# Patient Record
Sex: Female | Born: 1945 | Race: White | Hispanic: No | Marital: Married | Smoking: Former smoker
Health system: Southern US, Community
[De-identification: ages and names within clinical notes are randomized; demographics above are authoritative.]

## PROBLEM LIST (undated history)

## (undated) DIAGNOSIS — F329 Major depressive disorder, single episode, unspecified: Secondary | ICD-10-CM

## (undated) DIAGNOSIS — F32A Depression, unspecified: Secondary | ICD-10-CM

## (undated) DIAGNOSIS — K219 Gastro-esophageal reflux disease without esophagitis: Secondary | ICD-10-CM

## (undated) HISTORY — PX: CHOLECYSTECTOMY: SHX55

---

## 2016-03-21 ENCOUNTER — Emergency Department: Payer: PRIVATE HEALTH INSURANCE

## 2016-03-21 ENCOUNTER — Encounter: Payer: Self-pay | Admitting: Emergency Medicine

## 2016-03-21 ENCOUNTER — Observation Stay
Admission: EM | Admit: 2016-03-21 | Discharge: 2016-03-22 | Disposition: A | Discharge status: Home / Self Care | Payer: PRIVATE HEALTH INSURANCE | Attending: Internal Medicine | Admitting: Internal Medicine

## 2016-03-21 DIAGNOSIS — F419 Anxiety disorder, unspecified: Secondary | ICD-10-CM | POA: Diagnosis not present

## 2016-03-21 DIAGNOSIS — I081 Rheumatic disorders of both mitral and tricuspid valves: Secondary | ICD-10-CM | POA: Insufficient documentation

## 2016-03-21 DIAGNOSIS — Z87891 Personal history of nicotine dependence: Secondary | ICD-10-CM | POA: Diagnosis not present

## 2016-03-21 DIAGNOSIS — Z882 Allergy status to sulfonamides status: Secondary | ICD-10-CM | POA: Diagnosis not present

## 2016-03-21 DIAGNOSIS — E860 Dehydration: Secondary | ICD-10-CM

## 2016-03-21 DIAGNOSIS — K219 Gastro-esophageal reflux disease without esophagitis: Secondary | ICD-10-CM | POA: Diagnosis not present

## 2016-03-21 DIAGNOSIS — I10 Essential (primary) hypertension: Secondary | ICD-10-CM | POA: Diagnosis not present

## 2016-03-21 DIAGNOSIS — F329 Major depressive disorder, single episode, unspecified: Secondary | ICD-10-CM | POA: Diagnosis not present

## 2016-03-21 DIAGNOSIS — R112 Nausea with vomiting, unspecified: Secondary | ICD-10-CM | POA: Diagnosis not present

## 2016-03-21 DIAGNOSIS — R778 Other specified abnormalities of plasma proteins: Secondary | ICD-10-CM | POA: Insufficient documentation

## 2016-03-21 DIAGNOSIS — Z9049 Acquired absence of other specified parts of digestive tract: Secondary | ICD-10-CM | POA: Diagnosis not present

## 2016-03-21 DIAGNOSIS — Z79899 Other long term (current) drug therapy: Secondary | ICD-10-CM | POA: Diagnosis not present

## 2016-03-21 DIAGNOSIS — R1111 Vomiting without nausea: Secondary | ICD-10-CM

## 2016-03-21 DIAGNOSIS — R7989 Other specified abnormal findings of blood chemistry: Secondary | ICD-10-CM

## 2016-03-21 DIAGNOSIS — Z8249 Family history of ischemic heart disease and other diseases of the circulatory system: Secondary | ICD-10-CM | POA: Diagnosis not present

## 2016-03-21 HISTORY — DX: Gastro-esophageal reflux disease without esophagitis: K21.9

## 2016-03-21 HISTORY — DX: Depression, unspecified: F32.A

## 2016-03-21 HISTORY — DX: Major depressive disorder, single episode, unspecified: F32.9

## 2016-03-21 LAB — DIFFERENTIAL
BASOS PCT: 1 %
Basophils Absolute: 0 10*3/uL (ref 0–0.1)
EOS ABS: 0.1 10*3/uL (ref 0–0.7)
EOS PCT: 1 %
LYMPHS ABS: 1.6 10*3/uL (ref 1.0–3.6)
Lymphocytes Relative: 25 %
MONOS PCT: 6 %
Monocytes Absolute: 0.4 10*3/uL (ref 0.2–0.9)
NEUTROS PCT: 67 %
Neutro Abs: 4.4 10*3/uL (ref 1.4–6.5)

## 2016-03-21 LAB — COMPREHENSIVE METABOLIC PANEL
ALBUMIN: 4.7 g/dL (ref 3.5–5.0)
ALT: 23 U/L (ref 14–54)
ANION GAP: 9 (ref 5–15)
AST: 24 U/L (ref 15–41)
Alkaline Phosphatase: 103 U/L (ref 38–126)
BILIRUBIN TOTAL: 0.6 mg/dL (ref 0.3–1.2)
BUN: 28 mg/dL — ABNORMAL HIGH (ref 6–20)
CHLORIDE: 103 mmol/L (ref 101–111)
CO2: 28 mmol/L (ref 22–32)
Calcium: 10.2 mg/dL (ref 8.9–10.3)
Creatinine, Ser: 1.01 mg/dL — ABNORMAL HIGH (ref 0.44–1.00)
GFR calc Af Amer: >60 mL/min (ref >60)
GFR, EST NON AFRICAN AMERICAN: 56 mL/min — AB (ref >60)
Glucose, Bld: 121 mg/dL — ABNORMAL HIGH (ref 65–99)
POTASSIUM: 3.6 mmol/L (ref 3.5–5.1)
Sodium: 140 mmol/L (ref 135–145)
TOTAL PROTEIN: 8.2 g/dL — AB (ref 6.5–8.1)

## 2016-03-21 LAB — TROPONIN I
TROPONIN I: 0.12 ng/mL — AB (ref <0.03)
Troponin I: 0.14 ng/mL (ref <0.03)

## 2016-03-21 LAB — PROTIME-INR
INR: 0.92
Prothrombin Time: 12.3 seconds (ref 11.4–15.2)

## 2016-03-21 LAB — CBC
HCT: 44.5 % (ref 35.0–47.0)
HEMOGLOBIN: 15.4 g/dL (ref 12.0–16.0)
MCH: 31.7 pg (ref 26.0–34.0)
MCHC: 34.7 g/dL (ref 32.0–36.0)
MCV: 91.4 fL (ref 80.0–100.0)
Platelets: 151 10*3/uL (ref 150–440)
RBC: 4.87 MIL/uL (ref 3.80–5.20)
RDW: 14.5 % (ref 11.5–14.5)
WBC: 6.6 10*3/uL (ref 3.6–11.0)

## 2016-03-21 LAB — APTT: aPTT: 26 seconds (ref 24–36)

## 2016-03-21 LAB — TSH: TSH: 2.497 u[IU]/mL (ref 0.350–4.500)

## 2016-03-21 MED ORDER — SODIUM CHLORIDE 0.9 % IV BOLUS (SEPSIS)
1000.0000 mL | Freq: Once | INTRAVENOUS | Status: AC
  Administered 2016-03-21: 1000 mL via INTRAVENOUS

## 2016-03-21 MED ORDER — ZOLPIDEM TARTRATE 5 MG PO TABS
5.0000 mg | ORAL_TABLET | Freq: Every evening | ORAL | Status: DC | PRN
  Administered 2016-03-22: 5 mg via ORAL
  Filled 2016-03-21: qty 1

## 2016-03-21 MED ORDER — ATENOLOL 25 MG PO TABS
50.0000 mg | ORAL_TABLET | Freq: Every day | ORAL | Status: DC
  Administered 2016-03-21 – 2016-03-22 (×2): 50 mg via ORAL
  Filled 2016-03-21 (×2): qty 2

## 2016-03-21 MED ORDER — PANTOPRAZOLE SODIUM 40 MG PO TBEC
40.0000 mg | DELAYED_RELEASE_TABLET | Freq: Every day | ORAL | Status: DC
  Administered 2016-03-22: 40 mg via ORAL
  Filled 2016-03-21: qty 1

## 2016-03-21 MED ORDER — ENOXAPARIN SODIUM 40 MG/0.4ML ~~LOC~~ SOLN
40.0000 mg | SUBCUTANEOUS | Status: DC
  Administered 2016-03-21: 40 mg via SUBCUTANEOUS
  Filled 2016-03-21: qty 0.4

## 2016-03-21 MED ORDER — ACETAMINOPHEN 650 MG RE SUPP: 650.0000 mg | Freq: Four times a day (QID) | RECTAL | Status: DC | PRN

## 2016-03-21 MED ORDER — IBUPROFEN 400 MG PO TABS
400.0000 mg | ORAL_TABLET | Freq: Four times a day (QID) | ORAL | Status: DC | PRN
  Administered 2016-03-21: 400 mg via ORAL
  Filled 2016-03-21: qty 1

## 2016-03-21 MED ORDER — SODIUM CHLORIDE 0.9 % IV SOLN: 250.0000 mL | INTRAVENOUS | Status: DC | PRN

## 2016-03-21 MED ORDER — VENLAFAXINE HCL ER 75 MG PO CP24
150.0000 mg | ORAL_CAPSULE | Freq: Every day | ORAL | Status: DC
  Administered 2016-03-22: 150 mg via ORAL
  Filled 2016-03-21: qty 1

## 2016-03-21 MED ORDER — ASPIRIN 81 MG PO CHEW
324.0000 mg | CHEWABLE_TABLET | Freq: Once | ORAL | Status: AC
  Administered 2016-03-21: 324 mg via ORAL
  Filled 2016-03-21: qty 4

## 2016-03-21 MED ORDER — ONDANSETRON HCL 4 MG PO TABS: 4.0000 mg | ORAL_TABLET | Freq: Four times a day (QID) | ORAL | Status: DC | PRN

## 2016-03-21 MED ORDER — SODIUM CHLORIDE 0.9% FLUSH: 3.0000 mL | INTRAVENOUS | Status: DC | PRN

## 2016-03-21 MED ORDER — ASPIRIN EC 325 MG PO TBEC
325.0000 mg | DELAYED_RELEASE_TABLET | Freq: Every day | ORAL | Status: DC
  Administered 2016-03-22: 325 mg via ORAL
  Filled 2016-03-21: qty 1

## 2016-03-21 MED ORDER — ACETAMINOPHEN 325 MG PO TABS
650.0000 mg | ORAL_TABLET | Freq: Four times a day (QID) | ORAL | Status: DC | PRN
Start: 2016-03-21 — End: 2016-03-22
  Administered 2016-03-21 – 2016-03-22 (×2): 650 mg via ORAL
  Filled 2016-03-21 (×2): qty 2

## 2016-03-21 MED ORDER — SODIUM CHLORIDE 0.9% FLUSH: 3.0000 mL | Freq: Two times a day (BID) | INTRAVENOUS | Status: DC

## 2016-03-21 MED ORDER — SODIUM CHLORIDE 0.9% FLUSH
3.0000 mL | Freq: Two times a day (BID) | INTRAVENOUS | Status: DC
Start: 2016-03-21 — End: 2016-03-22

## 2016-03-21 MED ORDER — ONDANSETRON HCL 4 MG/2ML IJ SOLN: 4.0000 mg | Freq: Four times a day (QID) | INTRAMUSCULAR | Status: DC | PRN

## 2016-03-21 MED ORDER — IBUPROFEN 400 MG PO TABS
ORAL_TABLET | ORAL | Status: AC
Start: 2016-03-21 — End: 2016-03-21
  Administered 2016-03-21: 400 mg via ORAL
  Filled 2016-03-21: qty 1

## 2016-03-21 NOTE — Progress Notes (Signed)
Pt. Request something for sleep. Dr. Dahlia BailiffHugelmyer ordered 5mg  Ambien.

## 2016-03-21 NOTE — ED Notes (Signed)
Informed RN Bed Ready

## 2016-03-21 NOTE — ED Triage Notes (Signed)
Pt here from home with c/o dizziness and vomiting that began yesterday, has had vertigo in the past, took medicine for a headache yesterday with a little relief. Pt states headache again today. Speech clear, grips strong and equal bilaterally. Pt also c/o high blood pressure this am at walk in clinic, has always had hypotension, however, 174/104 today.

## 2016-03-21 NOTE — ED Notes (Signed)
Pt transported to 258

## 2016-03-21 NOTE — ED Notes (Signed)
Patient denies pain and is resting comfortably.  

## 2016-03-21 NOTE — ED Provider Notes (Signed)
[facility] Emergency Department Provider Note ____________________________________________   I have reviewed the triage vital signs and the triage nursing note.  HISTORY  Chief Complaint Dizziness and Emesis   Historian Patient and her friend (she is from [place], speaks good English, declines interpreter, friend helps some)  HPI Gail Bowman is a 70 y.o. female here from [place] visiting, woke up yesterday with vomiting - nonbloody and nonbilious, multiple times yesterday. She felt weak afterward in the afternoon. She still felt weak this morning which is why she came in for evaluation. Denies palpitations or chest discomfort. She feels like the nausea is essentially better now. No history of coronary artery disease. No reported history of hypertension. She does not take aspirin.  Weakness is moderate, she is feeling a tiny bit better after IV fluid bolus in the subwait area.    History reviewed. No pertinent past medical history.  There are no active problems to display for this patient.   Past Surgical History:  Procedure Laterality Date  . CHOLECYSTECTOMY      Prior to Admission medications   Not on File    Allergies  Allergen Reactions  . Sulfa Antibiotics Itching    No family history on file.  Social History Social History  Substance Use Topics  . Smoking status: Former Games developermoker  . Smokeless tobacco: Never Used  . Alcohol use Yes     Comment: occas.    Review of Systems  Constitutional: Negative for fever. Eyes: Negative for visual changes. ENT: Negative for sore throat. Cardiovascular: Negative for chest pain. Respiratory: Negative for shortness of breath. Gastrointestinal: Negative for diarrhea. Genitourinary: Negative for dysuria. Musculoskeletal: Negative for back pain. Skin: Negative for rash. Neurological: Negative for headache. 10 point Review of Systems otherwise  negative ____________________________________________   PHYSICAL EXAM:  VITAL SIGNS: ED Triage Vitals  Enc Vitals Group     BP 03/21/16 1205 (!) 174/104     Pulse Rate 03/21/16 1205 85     Resp 03/21/16 1432 16     Temp 03/21/16 1205 98.2 F (36.8 C)     Temp Source 03/21/16 1205 Oral     SpO2 03/21/16 1205 100 %     Weight 03/21/16 1206 149 lb 14.6 oz (68 kg)     Height 03/21/16 1206 5' 4.17" (1.63 m)     Head Circumference --      Peak Flow --      Pain Score 03/21/16 1207 5     Pain Loc --      Pain Edu? --      Excl. in GC? --      Constitutional: Alert and oriented. Well appearing and in no distress. HEENT   Head: Normocephalic and atraumatic.      Eyes: Conjunctivae are normal. PERRL. Normal extraocular movements.      Ears:         Nose: No congestion/rhinnorhea.   Mouth/Throat: Mucous membranes are moist.   Neck: No stridor. Cardiovascular/Chest: Normal rate, regular rhythm.  No murmurs, rubs, or gallops. Respiratory: Normal respiratory effort without tachypnea nor retractions. Breath sounds are clear and equal bilaterally. No wheezes/rales/rhonchi. Gastrointestinal: Soft. No distention, no guarding, no rebound. Nontender.    Genitourinary/rectal:Deferred Musculoskeletal: Nontender with normal range of motion in all extremities. No joint effusions.  No lower extremity tenderness.  No edema. Neurologic:  Normal speech and language. No gross or focal neurologic deficits are appreciated. Skin:  Skin is warm, dry and intact. No rash noted. Psychiatric:  Mood and affect are normal. Speech and behavior are normal. Patient exhibits appropriate insight and judgment.   ____________________________________________  LABS (pertinent positives/negatives)  Labs Reviewed  COMPREHENSIVE METABOLIC PANEL - Abnormal; Notable for the following:       Result Value   Glucose, Bld 121 (*)    BUN 28 (*)    Creatinine, Ser 1.01 (*)    Total Protein 8.2 (*)    GFR calc  non Af Amer 56 (*)    All other components within normal limits  TROPONIN I - Abnormal; Notable for the following:    Troponin I 0.12 (*)    All other components within normal limits  PROTIME-INR  APTT  CBC  DIFFERENTIAL  CBG MONITORING, ED    ____________________________________________    EKG I, Governor Rooks, MD, the attending physician have personally viewed and interpreted all ECGs.  80 bpm. Normal sinus rhythm. normal axis. Normal ST and T-wave ____________________________________________  RADIOLOGY All Xrays were viewed by me. Imaging interpreted by Radiologist.  CT without contrast: No hemorrhage or visible infarct. ASPECTS is 10.  CXR 2 view: No active cardiopulmonary disease __________________________________________  PROCEDURES  Procedure(s) performed: None  Critical Care performed: None  ____________________________________________   ED COURSE / ASSESSMENT AND PLAN  Pertinent labs & imaging results that were available during my care of the patient were reviewed by me and considered in my medical decision making (see chart for details).   [name] is here with her friend, for weakness after vomiting all day yesterday.  Her BUN and creatinine are elevated consistent with a mild acute renal failure which is suspicious for dehydration given the clinical history. Her troponin is slightly elevated 0.12, and her EKG is reassuring today. However this is pretty abnormal, I discussed with her my recommendation for further evaluation and observation overnight.  She was given aspirin here in the emergency department. She is given a second liter fluid for the dehydration.    CONSULTATIONS:   Hospitalist for admission.   Patient / Family / Caregiver informed of clinical course, medical decision-making process, and agree with plan.  ___________________________________________   FINAL CLINICAL IMPRESSION(S) / ED DIAGNOSES   Final diagnoses:  Troponin I  above reference range  Dehydration  Non-intractable vomiting without nausea, vomiting of unspecified type              Note: This dictation was prepared with Dragon dictation. Any transcriptional errors that result from this process are unintentional    Governor Rooks, MD 03/21/16 1554

## 2016-03-21 NOTE — H&P (Signed)
[facility] [place] [facility]   PATIENT NAME: Gail Bowman    MR#:  [id number]  DATE OF BIRTH:  1945/12/24  DATE OF ADMISSION:  03/21/2016  PRIMARY CARE PHYSICIAN: No primary care provider on file.   REQUESTING/REFERRING PHYSICIAN: Dr Shaune Pollack  CHIEF COMPLAINT:   Chief Complaint  Patient presents with  . Dizziness  . Emesis    HISTORY OF PRESENT ILLNESS: Gail Bowman  is a 70 y.o. female with a known history of  Depression And GERD who started having nausea and vomiting since yesterday. Patient's symptoms continue persist throughout the day. And then the nausea and vomiting stopped last night. This morning she started feeling dizzy. Therefore she came to the ER. In the ER she is noted to have a troponin that is elevated. She she did have some substernal discomfort yesterday but none today. Did not have any shortness of breath no palpitations no radiation of the pain. PAST MEDICAL HISTORY:   Past Medical History:  Diagnosis Date  . Depression   . GERD (gastroesophageal reflux disease)     PAST SURGICAL HISTORY: Past Surgical History:  Procedure Laterality Date  . CHOLECYSTECTOMY      SOCIAL HISTORY:  Social History  Substance Use Topics  . Smoking status: Former Games developer  . Smokeless tobacco: Never Used  . Alcohol use Yes     Comment: occas.    FAMILY HISTORY:  Family History  Problem Relation Age of Onset  . CAD Sister     DRUG ALLERGIES:  Allergies  Allergen Reactions  . Sulfa Antibiotics Itching    REVIEW OF SYSTEMS:   CONSTITUTIONAL: No fever, fatigue or weakness.  EYES: No blurred or double vision.  EARS, NOSE, AND THROAT: No tinnitus or ear pain.  RESPIRATORY: No cough, shortness of breath, wheezing or hemoptysis.  CARDIOVASCULAR: No chest pain, orthopnea, edema.  GASTROINTESTINAL: No nausea, vomiting, diarrhea or abdominal pain.  GENITOURINARY: No dysuria, hematuria.  ENDOCRINE: No polyuria, nocturia,  HEMATOLOGY: No  anemia, easy bruising or bleeding SKIN: No rash or lesion. MUSCULOSKELETAL: No joint pain or arthritis.   NEUROLOGIC: No tingling, numbness, weakness.  PSYCHIATRY: No anxiety or depression.   MEDICATIONS AT HOME:  Prior to Admission medications   Medication Sig Start Date End Date Taking? Authorizing Provider  ibuprofen (ADVIL,MOTRIN) 400 MG tablet Take 400 mg by mouth every 6 (six) hours as needed.   Yes Historical Provider, MD  omeprazole (PRILOSEC) 10 MG capsule Take 10 mg by mouth daily.   Yes Historical Provider, MD  venlafaxine (EFFEXOR) 75 MG tablet Take 150 mg by mouth daily.    Yes Historical Provider, MD  acetaminophen (TYLENOL) 500 MG tablet Take 500 mg by mouth every 6 (six) hours as needed.    Historical Provider, MD      PHYSICAL EXAMINATION:   VITAL SIGNS: Blood pressure (!) 175/85, pulse 83, temperature 98.2 F (36.8 C), temperature source Oral, resp. rate 16, height 5' 4.17" (1.63 m), weight 149 lb 14.6 oz (68 kg), SpO2 99 %.  GENERAL:  70 y.o.-year-old patient lying in the bed with no acute distress.  EYES: Pupils equal, round, reactive to light and accommodation. No scleral icterus. Extraocular muscles intact.  HEENT: Head atraumatic, normocephalic. Oropharynx and nasopharynx clear.  NECK:  Supple, no jugular venous distention. No thyroid enlargement, no tenderness.  LUNGS: Normal breath sounds bilaterally, no wheezing, rales,rhonchi or crepitation. No use of accessory muscles of respiration.  CARDIOVASCULAR: S1, S2 normal. No murmurs, rubs, or gallops.  ABDOMEN: Soft, nontender, nondistended. Bowel sounds present. No organomegaly or mass.  EXTREMITIES: No pedal edema, cyanosis, or clubbing.  NEUROLOGIC: Cranial nerves II through XII are intact. Muscle strength 5/5 in all extremities. Sensation intact. Gait not checked.  PSYCHIATRIC: The patient is alert and oriented x 3.  SKIN: No obvious rash, lesion, or ulcer.   LABORATORY PANEL:   CBC  Recent Labs Lab  03/21/16 1220  WBC 6.6  HGB 15.4  HCT 44.5  PLT 151  MCV 91.4  MCH 31.7  MCHC 34.7  RDW 14.5  LYMPHSABS 1.6  MONOABS 0.4  EOSABS 0.1  BASOSABS 0.0   ------------------------------------------------------------------------------------------------------------------  Chemistries   Recent Labs Lab 03/21/16 1220  NA 140  K 3.6  CL 103  CO2 28  GLUCOSE 121*  BUN 28*  CREATININE 1.01*  CALCIUM 10.2  AST 24  ALT 23  ALKPHOS 103  BILITOT 0.6   ------------------------------------------------------------------------------------------------------------------ estimated creatinine clearance is 50.7 mL/min (by C-G formula based on SCr of 1.01 mg/dL (H)). ------------------------------------------------------------------------------------------------------------------ No results for input(s): TSH, T4TOTAL, T3FREE, THYROIDAB in the last 72 hours.  Invalid input(s): FREET3   Coagulation profile  Recent Labs Lab 03/21/16 1220  INR 0.92   ------------------------------------------------------------------------------------------------------------------- No results for input(s): DDIMER in the last 72 hours. -------------------------------------------------------------------------------------------------------------------  Cardiac Enzymes  Recent Labs Lab 03/21/16 1220  TROPONINI 0.12*   ------------------------------------------------------------------------------------------------------------------ Invalid input(s): POCBNP  ---------------------------------------------------------------------------------------------------------------  Urinalysis No results found for: COLORURINE, APPEARANCEUR, LABSPEC, PHURINE, GLUCOSEU, HGBUR, BILIRUBINUR, KETONESUR, PROTEINUR, UROBILINOGEN, NITRITE, LEUKOCYTESUR   RADIOLOGY: Dg Chest 2 View  Result Date: 03/21/2016 CLINICAL DATA:  Nausea, vomiting, dizziness since yesterday. Chest pain. EXAM: CHEST  2 VIEW COMPARISON:  None.  FINDINGS: The heart size and mediastinal contours are within normal limits. Both lungs are clear. The visualized skeletal structures are unremarkable. IMPRESSION: No active cardiopulmonary disease. Electronically Signed   By: Charlett NoseKevin  Dover M.D.   On: 03/21/2016 15:30   Ct Head Code Stroke W/o Cm  Result Date: 03/21/2016 CLINICAL DATA:  Code stroke. Dizziness and vomiting that began yesterday. EXAM: CT HEAD WITHOUT CONTRAST TECHNIQUE: Contiguous axial images were obtained from the base of the skull through the vertex without intravenous contrast. COMPARISON:  None. FINDINGS: No hemorrhage or visible infarct. No hydrocephalus, mass, or swelling. No hyperdense vessel. Minimal periventricular white matter low density, likely mild chronic microvascular ischemia in this patient with history of hypertension ASPECTS (Alberta Stroke Program Early CT Score) - Ganglionic level infarction (caudate, lentiform nuclei, internal capsule, insula, M1-M3 cortex): 7 - Supraganglionic infarction (M4-M6 cortex): 3 Total score (0-10 with 10 being normal): 10 These results were called by telephone at the time of interpretation on 03/21/2016 at 12:41 pm to Dr. Willy EddyPATRICK ROBINSON , who verbally acknowledged these results. IMPRESSION: 1. No hemorrhage or visible infarct. 2. ASPECTS is 10. Electronically Signed   By: Marnee SpringJonathon  Watts M.D.   On: 03/21/2016 12:43    EKG: Orders placed or performed during the hospital encounter of 03/21/16  . ED EKG  . ED EKG    IMPRESSION AND PLAN: Patient is a 70 year old white female with nausea vomiting noted to have elevated troponin  1. Elevated troponin possibly related to demand ischemia from her nausea vomiting We will place under observation, monitor serial cardiac enzymes We will ask cardiology to see Treat with aspirin Echocardiogram of the heart  2. Elevated blood pressure with no history of hypertension We'll start patient on atenolol  3. Depression and anxiety continue  Effexor  4. GERD we'll continue PPI  5. Miscellaneous we'll do Lovenox for DVT prophylaxis   All the records are reviewed and case discussed with ED provider. Management plans discussed with the patient, family and they are in agreement.  CODE STATUS:    Code Status Orders        Start     Ordered   03/21/16 1538  Full code  Continuous     03/21/16 1538    Code Status History    Date Active Date Inactive Code Status Order ID Comments User Context   This patient has a current code status but no historical code status.       TOTAL TIME TAKING CARE OF THIS PATIENT: 45 minutes.    Auburn Bilberry M.D on 03/21/2016 at 3:42 PM  Between 7am to 6pm - Pager - [phone]  After 6pm go to [email or web address] - password [email or web address] [facility]  [facility]  Office  [phone]  CC: Primary care physician; No primary care provider on file.

## 2016-03-22 ENCOUNTER — Observation Stay
Admit: 2016-03-22 | Discharge: 2016-03-22 | Disposition: A | Discharge status: Home / Self Care | Payer: PRIVATE HEALTH INSURANCE | Attending: Internal Medicine | Admitting: Internal Medicine

## 2016-03-22 DIAGNOSIS — R112 Nausea with vomiting, unspecified: Secondary | ICD-10-CM | POA: Diagnosis not present

## 2016-03-22 LAB — ECHOCARDIOGRAM COMPLETE
HEIGHTINCHES: 64 in
Weight: 2304 oz

## 2016-03-22 LAB — TROPONIN I
Troponin I: 0.12 ng/mL (ref <0.03)
Troponin I: 0.1 ng/mL (ref <0.03)

## 2016-03-22 MED ORDER — ISOSORBIDE MONONITRATE ER 30 MG PO TB24: 30.0000 mg | ORAL_TABLET | Freq: Every day | ORAL | 0 refills | Status: AC

## 2016-03-22 MED ORDER — METOPROLOL TARTRATE 25 MG PO TABS: 25.0000 mg | ORAL_TABLET | Freq: Two times a day (BID) | ORAL | 0 refills | Status: AC

## 2016-03-22 MED ORDER — ASPIRIN EC 81 MG PO TBEC: 81.0000 mg | DELAYED_RELEASE_TABLET | Freq: Every day | ORAL | 0 refills | Status: DC

## 2016-03-22 MED ORDER — ISOSORBIDE MONONITRATE ER 30 MG PO TB24: 30.0000 mg | ORAL_TABLET | Freq: Every day | ORAL | 0 refills | Status: DC

## 2016-03-22 MED ORDER — ASPIRIN EC 81 MG PO TBEC: 81.0000 mg | DELAYED_RELEASE_TABLET | Freq: Every day | ORAL | 0 refills | Status: AC

## 2016-03-22 MED ORDER — METOPROLOL TARTRATE 25 MG PO TABS: 25.0000 mg | ORAL_TABLET | Freq: Two times a day (BID) | ORAL | Status: DC

## 2016-03-22 NOTE — Consult Note (Signed)
[facility] Cardiology  CARDIOLOGY CONSULT NOTE  Patient ID: Gail Bowman MRN: [id number] DOB/AGE: [birth year] 70 y.o.  Admit date: 03/21/2016 Referring Physician Allena Katz Primary Physician  Primary Cardiologist  Reason for Consultation Borderline elevated troponin  HPI: 70 year old female referred for borderline elevated troponin. The patient is visiting from [place]. She has a history of gastroesophageal reflux disease. She presented to [facility] emergency room with nausea and vomiting. Admission labs were notable for borderline elevated troponin of 0.12, followed by 0.14, 0.12, and 0.1. The patient denies a history of chest pain or shortness of breath. Nausea and vomiting has resolved. ECG was nondiagnostic.  Review of systems complete and found to be negative unless listed above     Past Medical History:  Diagnosis Date  . Depression   . GERD (gastroesophageal reflux disease)     Past Surgical History:  Procedure Laterality Date  . CHOLECYSTECTOMY      Prescriptions Prior to Admission  Medication Sig Dispense Refill Last Dose  . ibuprofen (ADVIL,MOTRIN) 400 MG tablet Take 400 mg by mouth every 6 (six) hours as needed.   03/20/2016 at Unknown time  . omeprazole (PRILOSEC) 10 MG capsule Take 10 mg by mouth daily.   03/21/2016 at 1000  . venlafaxine (EFFEXOR) 75 MG tablet Take 150 mg by mouth daily.    03/21/2016 at 1000  . acetaminophen (TYLENOL) 500 MG tablet Take 500 mg by mouth every 6 (six) hours as needed.      Social History   Social History  . Marital status: Married    Spouse name: N/A  . Number of children: N/A  . Years of education: N/A   Occupational History  . Not on file.   Social History Main Topics  . Smoking status: Former Games developer  . Smokeless tobacco: Never Used  . Alcohol use 0.6 oz/week    1 Cans of beer per week     Comment: occas.  . Drug use: No  . Sexual activity: Not on file   Other Topics Concern  . Not on file   Social History Narrative  . No narrative  on file    Family History  Problem Relation Age of Onset  . CAD Sister       Review of systems complete and found to be negative unless listed above      PHYSICAL EXAM  General: Well developed, well nourished, in no acute distress HEENT:  Normocephalic and atramatic Neck:  No JVD.  Lungs: Clear bilaterally to auscultation and percussion. Heart: HRRR . Normal S1 and S2 without gallops or murmurs.  Abdomen: Bowel sounds are positive, abdomen soft and non-tender  Msk:  Back normal, normal gait. Normal strength and tone for age. Extremities: No clubbing, cyanosis or edema.   Neuro: Alert and oriented X 3. Psych:  Good affect, responds appropriately  Labs:   Lab Results  Component Value Date   WBC 6.6 03/21/2016   HGB 15.4 03/21/2016   HCT 44.5 03/21/2016   MCV 91.4 03/21/2016   PLT 151 03/21/2016    Recent Labs Lab 03/21/16 1220  NA 140  K 3.6  CL 103  CO2 28  BUN 28*  CREATININE 1.01*  CALCIUM 10.2  PROT 8.2*  BILITOT 0.6  ALKPHOS 103  ALT 23  AST 24  GLUCOSE 121*   Lab Results  Component Value Date   TROPONINI 0.10 (HH) 03/22/2016   No results found for: CHOL No results found for: HDL No results found for: LDLCALC No results found  for: TRIG No results found for: CHOLHDL No results found for: LDLDIRECT    Radiology: Dg Chest 2 View  Result Date: 03/21/2016 CLINICAL DATA:  Nausea, vomiting, dizziness since yesterday. Chest pain. EXAM: CHEST  2 VIEW COMPARISON:  None. FINDINGS: The heart size and mediastinal contours are within normal limits. Both lungs are clear. The visualized skeletal structures are unremarkable. IMPRESSION: No active cardiopulmonary disease. Electronically Signed   By: Charlett NoseKevin  Dover M.D.   On: 03/21/2016 15:30   Ct Head Code Stroke W/o Cm  Result Date: 03/21/2016 CLINICAL DATA:  Code stroke. Dizziness and vomiting that began yesterday. EXAM: CT HEAD WITHOUT CONTRAST TECHNIQUE: Contiguous axial images were obtained from the base of  the skull through the vertex without intravenous contrast. COMPARISON:  None. FINDINGS: No hemorrhage or visible infarct. No hydrocephalus, mass, or swelling. No hyperdense vessel. Minimal periventricular white matter low density, likely mild chronic microvascular ischemia in this patient with history of hypertension ASPECTS (Alberta Stroke Program Early CT Score) - Ganglionic level infarction (caudate, lentiform nuclei, internal capsule, insula, M1-M3 cortex): 7 - Supraganglionic infarction (M4-M6 cortex): 3 Total score (0-10 with 10 being normal): 10 These results were called by telephone at the time of interpretation on 03/21/2016 at 12:41 pm to Dr. Willy EddyPATRICK ROBINSON , who verbally acknowledged these results. IMPRESSION: 1. No hemorrhage or visible infarct. 2. ASPECTS is 10. Electronically Signed   By: Marnee SpringJonathon  Watts M.D.   On: 03/21/2016 12:43    EKG: Normal sinus rhythm  ASSESSMENT AND PLAN:   1. Borderline elevated troponin, without peak or trough, in the absence of chest pain, in the setting of nausea and vomiting, with nondiagnostic ECG, suspect demand supply ischemia and not acute coronary syndrome  Recommendations  1. Agree with overall current therapy 2. Defer full dose anticoagulation 3. Review 2-D echocardiogram 4. Further workup could be performed as outpatient  Signed: Keyleigh Manninen MD,PhD, [facility] 03/22/2016, 9:16 AM

## 2016-03-22 NOTE — Discharge Summary (Signed)
Gail Bowman, is a 70 y.o. female  DOB May 15, 1946  MRN [id number].  Admission date:  03/21/2016  Admitting Physician  Auburn Bilberry, MD  Discharge Date:  03/22/2016   Primary MD  Pcp Not In System  Recommendations for primary care physician for things to follow:  Follow up with  PMD in [place] in one week   Admission Diagnosis  Dehydration [E86.0] Troponin I above reference range [R79.89] Non-intractable vomiting without nausea, vomiting of unspecified type [R11.11]   Discharge Diagnosis  Dehydration [E86.0] Troponin I above reference range [R79.89] Non-intractable vomiting without nausea, vomiting of unspecified type [R11.11]    Active Problems:   Elevated troponin      Past Medical History:  Diagnosis Date  . Depression   . GERD (gastroesophageal reflux disease)     Past Surgical History:  Procedure Laterality Date  . CHOLECYSTECTOMY         History of present illness and  Hospital Course:     Kindly see H&P for history of present illness and admission details, please review complete Labs, Consult reports and Test reports for all details in brief  HPI  from the history and physical done on the day of admission 70 year old female patient without any past medical history comes in because of vomiting, found to have a troponin of 0.12/0.14. Because of that she is admitted to hospitalist service for evaluation of acute MI.   Hospital Course  #1 intractable nausea and vomiting causing elevated troponins: Patient admitted to telemetry, started on aspirin, metoprolol, seen by cardiology. Did not have any chest pain, no EKG changes. Patient lab data CBC, Chem-7 within normal limits. Cardiology felt the patient can have echocardiogram and have further cardiac workup and outpatient. Patient discharging back to  [place] on Tuesday. Explained to her that patient if the echocardiogram is normal she'll be discharged home and she can follow up with her primary doctor in [place] and have further cardiac imaging is as needed. #2-elevated hypertension elevated blood pressure without history of hypertension. Patient will be given prescription for metoprolol, Imdur, aspirin. pt has no questions   Discharge Condition: stable   Follow UP      Discharge Instructions  and  Discharge Medications        Medication List    TAKE these medications   acetaminophen 500 MG tablet Commonly known as:  TYLENOL Take 500 mg by mouth every 6 (six) hours as needed.   aspirin EC 81 MG tablet Take 1 tablet (81 mg total) by mouth daily.   ibuprofen 400 MG tablet Commonly known as:  ADVIL,MOTRIN Take 400 mg by mouth every 6 (six) hours as needed.   isosorbide mononitrate 30 MG 24 hr tablet Commonly known as:  IMDUR Take 1 tablet (30 mg total) by mouth daily.   metoprolol tartrate 25 MG tablet Commonly known as:  LOPRESSOR Take 1 tablet (25 mg total) by mouth 2 (two) times daily.   omeprazole 10 MG capsule Commonly known as:  PRILOSEC Take 10 mg by mouth daily.   venlafaxine 75 MG tablet Commonly known as:  EFFEXOR Take 150 mg by mouth daily.         Diet and Activity recommendation: See Discharge Instructions above   Consults obtained - cardio   Major procedures and Radiology Reports - PLEASE review detailed and final reports for all details, in brief -     Dg Chest 2 View  Result Date: 03/21/2016 CLINICAL DATA:  Nausea, vomiting, dizziness since yesterday.  Chest pain. EXAM: CHEST  2 VIEW COMPARISON:  None. FINDINGS: The heart size and mediastinal contours are within normal limits. Both lungs are clear. The visualized skeletal structures are unremarkable. IMPRESSION: No active cardiopulmonary disease. Electronically Signed   By: Charlett NoseKevin  Dover M.D.   On: 03/21/2016 15:30   Ct Head Code Stroke  W/o Cm  Result Date: 03/21/2016 CLINICAL DATA:  Code stroke. Dizziness and vomiting that began yesterday. EXAM: CT HEAD WITHOUT CONTRAST TECHNIQUE: Contiguous axial images were obtained from the base of the skull through the vertex without intravenous contrast. COMPARISON:  None. FINDINGS: No hemorrhage or visible infarct. No hydrocephalus, mass, or swelling. No hyperdense vessel. Minimal periventricular white matter low density, likely mild chronic microvascular ischemia in this patient with history of hypertension ASPECTS (Alberta Stroke Program Early CT Score) - Ganglionic level infarction (caudate, lentiform nuclei, internal capsule, insula, M1-M3 cortex): 7 - Supraganglionic infarction (M4-M6 cortex): 3 Total score (0-10 with 10 being normal): 10 These results were called by telephone at the time of interpretation on 03/21/2016 at 12:41 pm to Dr. Willy EddyPATRICK ROBINSON , who verbally acknowledged these results. IMPRESSION: 1. No hemorrhage or visible infarct. 2. ASPECTS is 10. Electronically Signed   By: Marnee SpringJonathon  Watts M.D.   On: 03/21/2016 12:43    Micro Results    No results found for this or any previous visit (from the past 240 hour(s)).     Today   Subjective:   Cameren Decker today has no headache,no chest abdominal pain,no new weakness tingling or numbness, feels much better wants to go home today.   Objective:   Blood pressure 126/61, pulse 63, temperature 98.5 F (36.9 C), temperature source Oral, resp. rate 18, height 5\' 4"  (1.626 m), weight 65.3 kg (144 lb), SpO2 97 %.   Intake/Output Summary (Last 24 hours) at 03/22/16 1053 Last data filed at 03/22/16 0900  Gross per 24 hour  Intake              240 ml  Output                1 ml  Net              239 ml    Exam Awake Alert, Oriented x 3, No new F.N deficits, Normal affect [name].AT,PERRAL Supple Neck,No JVD, No cervical lymphadenopathy appriciated.  Symmetrical Chest wall movement, Good air movement bilaterally,  CTAB RRR,No Gallops,Rubs or new Murmurs, No Parasternal Heave +ve B.Sounds, Abd Soft, Non tender, No organomegaly appriciated, No rebound -guarding or rigidity. No Cyanosis, Clubbing or edema, No new Rash or bruise  Data Review   CBC w Diff:  Lab Results  Component Value Date   WBC 6.6 03/21/2016   HGB 15.4 03/21/2016   HCT 44.5 03/21/2016   PLT 151 03/21/2016   LYMPHOPCT 25 03/21/2016   MONOPCT 6 03/21/2016   EOSPCT 1 03/21/2016   BASOPCT 1 03/21/2016    CMP:  Lab Results  Component Value Date   NA 140 03/21/2016   K 3.6 03/21/2016   CL 103 03/21/2016   CO2 28 03/21/2016   BUN 28 (H) 03/21/2016   CREATININE 1.01 (H) 03/21/2016   PROT 8.2 (H) 03/21/2016   ALBUMIN 4.7 03/21/2016   BILITOT 0.6 03/21/2016   ALKPHOS 103 03/21/2016   AST 24 03/21/2016   ALT 23 03/21/2016  .   Total Time in preparing paper work, data evaluation and todays exam - 35 minutes  Breiana Stratmann M.D on 03/22/2016 at 10:53 AM  Note: This dictation was prepared with Dragon dictation along with smaller phrase technology. Any transcriptional errors that result from this process are unintentional.

## 2016-03-22 NOTE — Progress Notes (Signed)
A&O. Independent. Medicated for headache and insomnia. Slept the rest of the night with no complaints.

## 2018-05-31 IMAGING — CT CT HEAD CODE STROKE
3 series · 15 of 46 positions shown, 18 images · non-contrast
Comparison: None.

CLINICAL DATA: Code stroke. Dizziness and vomiting that began
yesterday.

EXAM:
CT HEAD WITHOUT CONTRAST
TECHNIQUE: Contiguous axial images were obtained from the base of the skull
through the vertex without intravenous contrast.

[Series 2: head wo · axial · 0.41mm/px · z∈[-36,+84]mm · 9 of 29 slices shown, 12 images]
[im 3/29  brain]
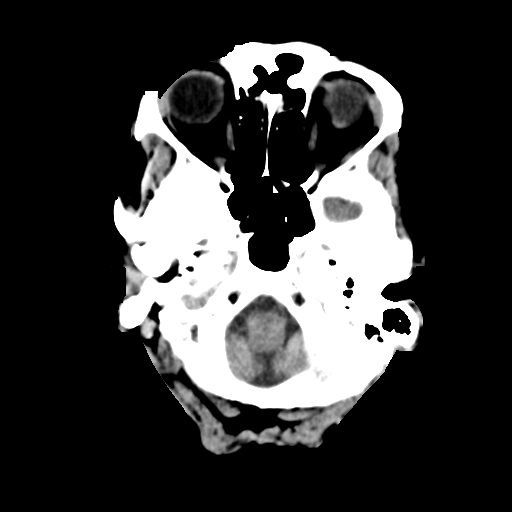
[im 3/29  bone]
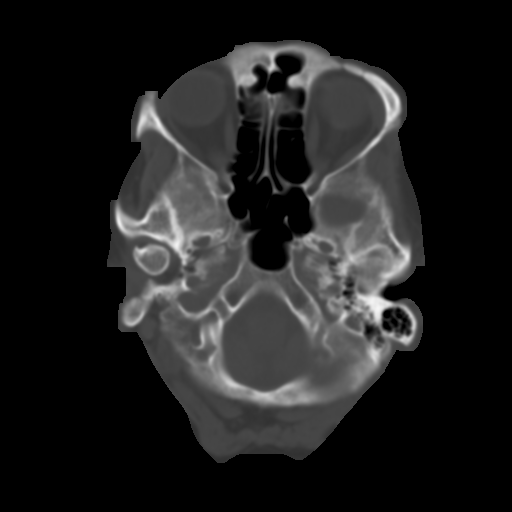
[im 6/29  brain]
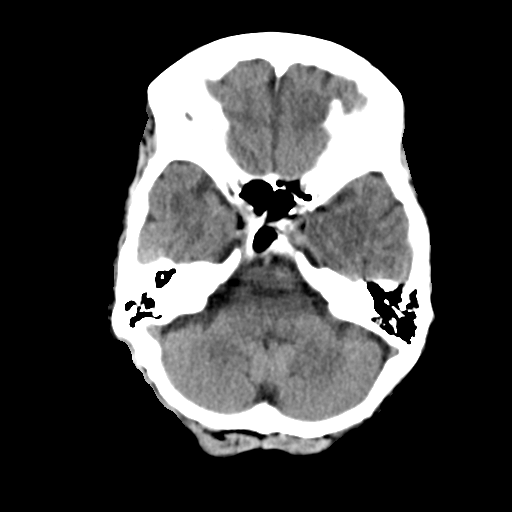
[im 9/29  brain]
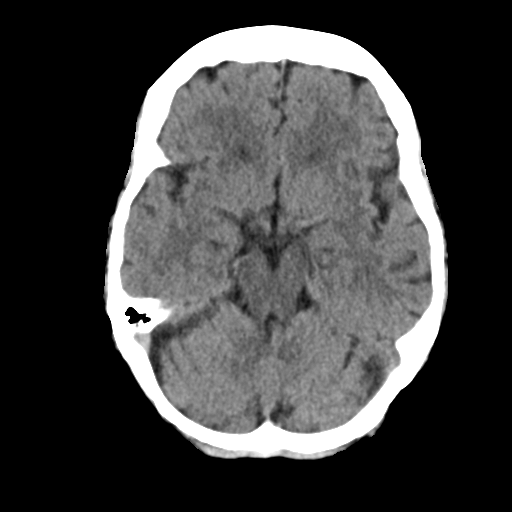
[im 12/29  brain]
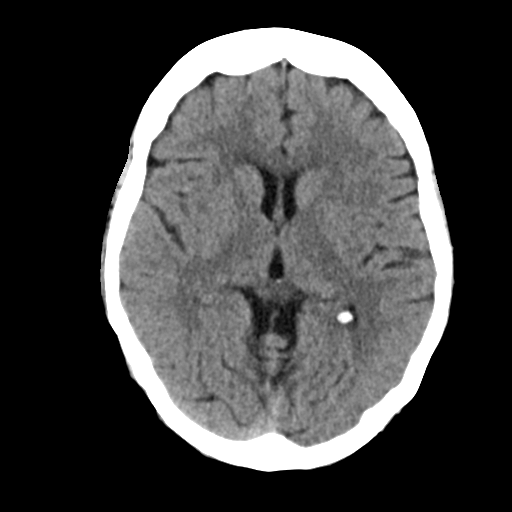
[im 15/29  brain]
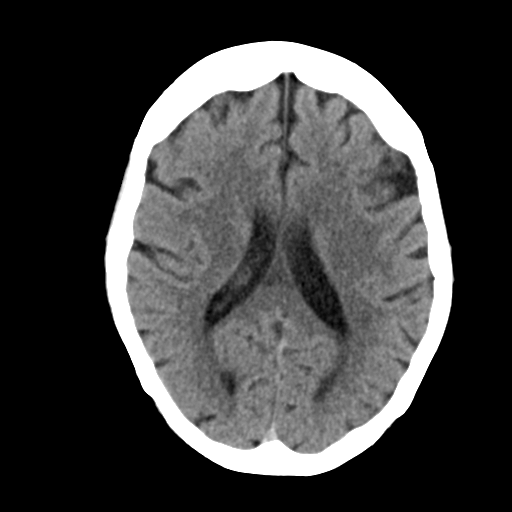
[im 15/29  bone]
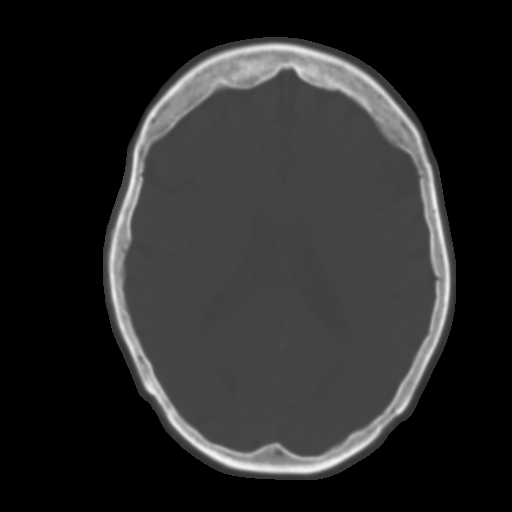
[im 18/29  brain]
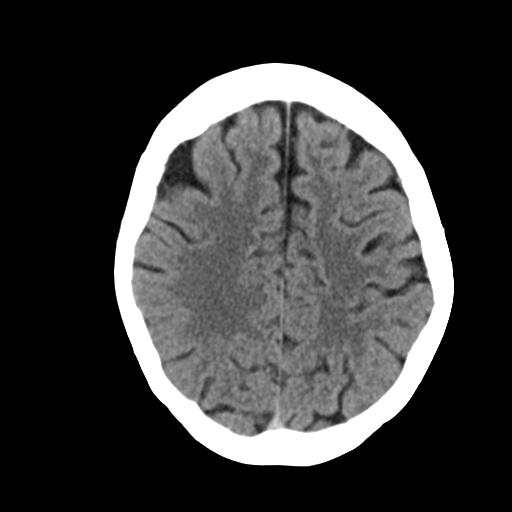
[im 21/29  brain]
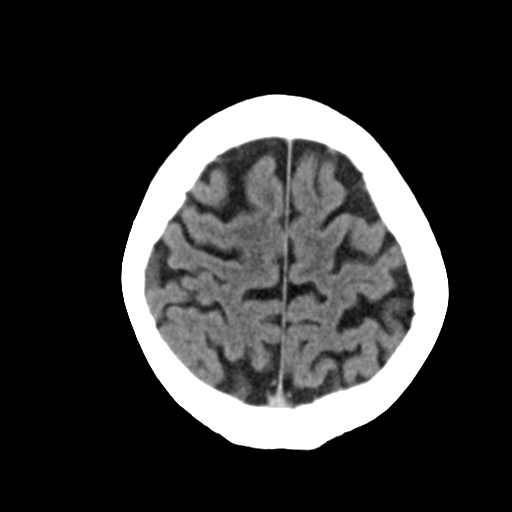
[im 24/29  brain]
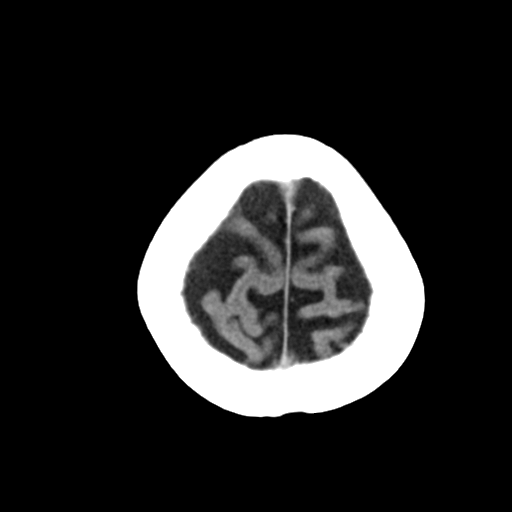
[im 27/29  brain]
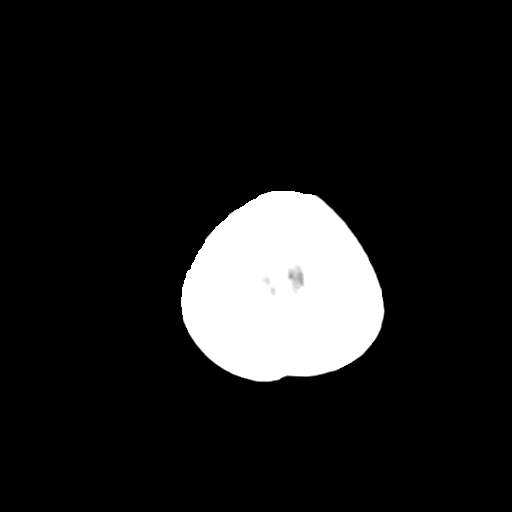
[im 27/29  bone]
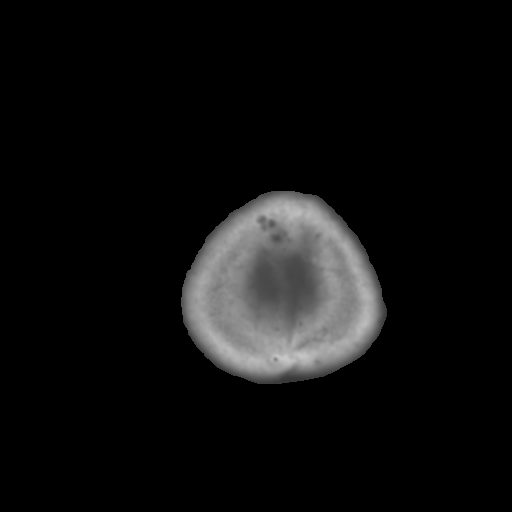

[Series 4: coronal soft tissue · coronal · 0.29mm/px · 3 of 63 slices shown]
[im 21/63  brain]
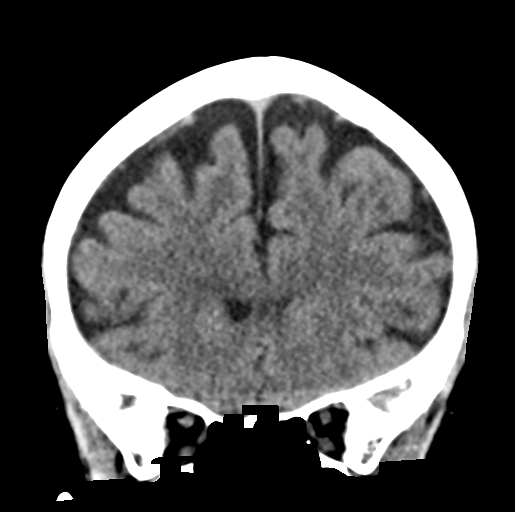
[im 28/63  brain]
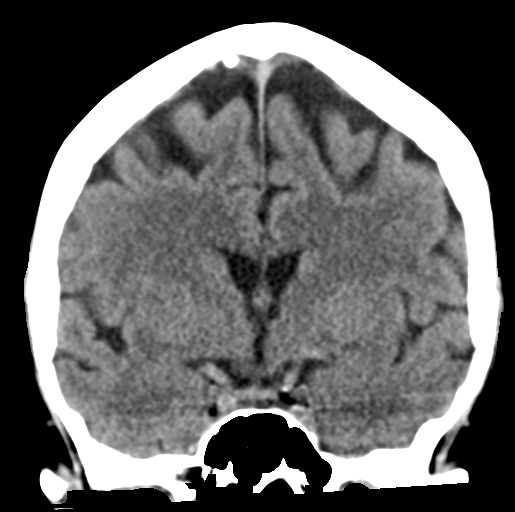
[im 35/63  brain]
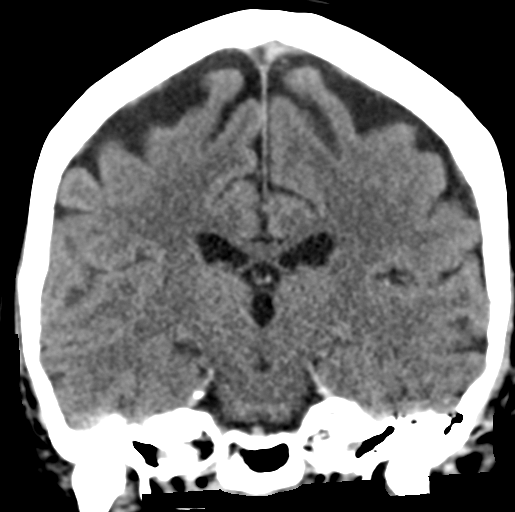

[Series 5: sagittal soft tissue · sagittal · 0.28mm/px · 3 of 51 slices shown]
[im 17/51  brain]
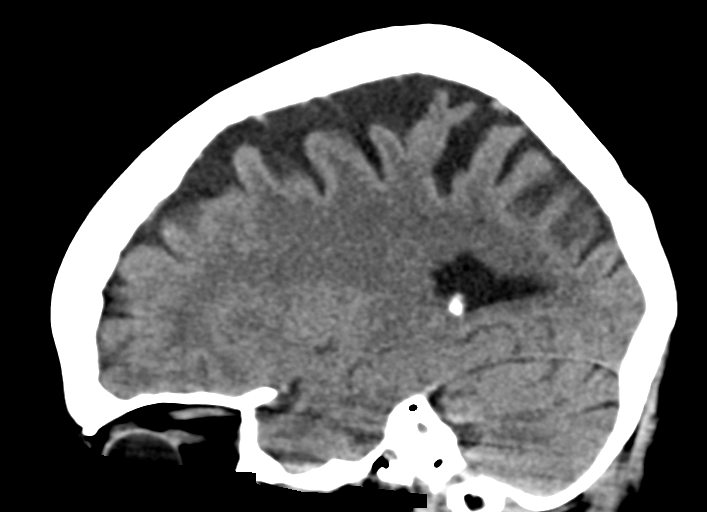
[im 26/51  brain]
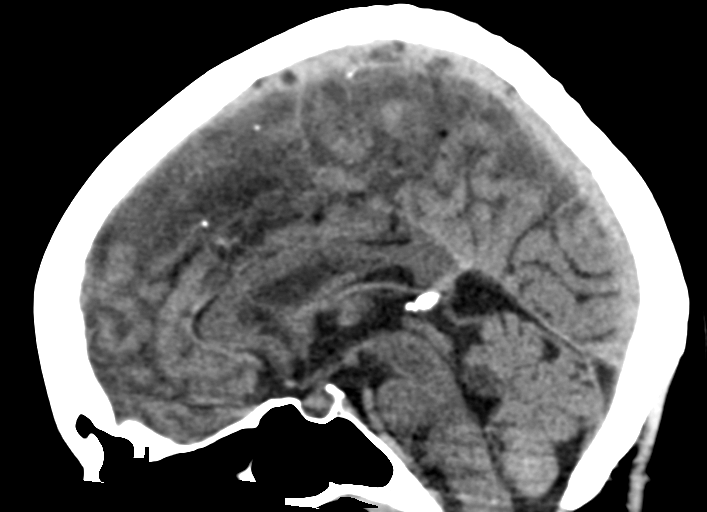
[im 34/51  brain]
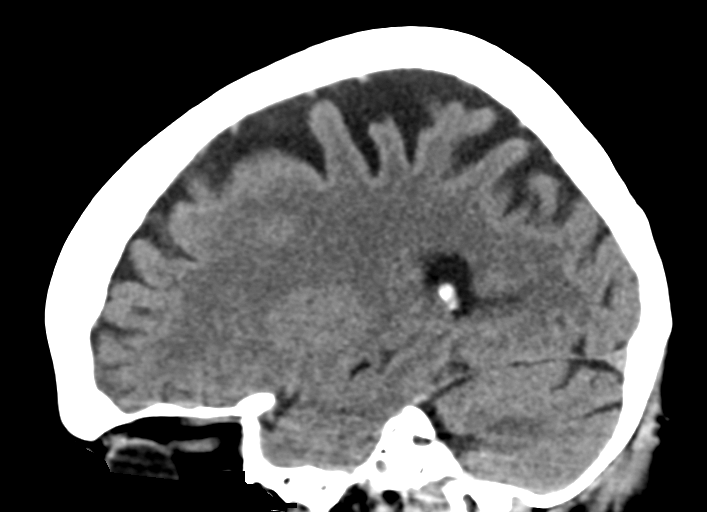

[15 of 46 positions shown; findings below may reference images not displayed]

FINDINGS: No hemorrhage or visible infarct. No hydrocephalus, mass, or
swelling. No hyperdense vessel. Minimal periventricular white matter
low density, likely mild chronic microvascular ischemia in this
patient with history of hypertension

ASPECTS (Alberta Stroke Program Early CT Score)

- Ganglionic level infarction (caudate, lentiform nuclei, internal
capsule, insula, M1-M3 cortex): 7

- Supraganglionic infarction (M4-M6 cortex): 3

Total score (0-10 with 10 being normal): 10

These results were called by telephone at the time of interpretation
on 03/21/2016 at [DATE] to Dr. BRITO LOPES LAMBILLIOTTE , who verbally
acknowledged these results.
IMPRESSION: 1. No hemorrhage or visible infarct.
2. ASPECTS is 10.
